# Patient Record
Sex: Female | Born: 2005 | Race: Black or African American | Hispanic: No | Marital: Single | Smoking: Never smoker
Health system: Southern US, Community
[De-identification: ages and names within clinical notes are randomized; demographics above are authoritative.]

## PROBLEM LIST (undated history)

## (undated) DIAGNOSIS — Z21 Asymptomatic human immunodeficiency virus [HIV] infection status: Secondary | ICD-10-CM

## (undated) DIAGNOSIS — B20 Human immunodeficiency virus [HIV] disease: Secondary | ICD-10-CM

## (undated) DIAGNOSIS — J45909 Unspecified asthma, uncomplicated: Secondary | ICD-10-CM

---
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
## 2023-07-12 ENCOUNTER — Other Ambulatory Visit: Payer: Self-pay

## 2023-07-12 ENCOUNTER — Encounter (HOSPITAL_COMMUNITY): Payer: Self-pay

## 2023-07-12 ENCOUNTER — Emergency Department (HOSPITAL_COMMUNITY)
Admission: EM | Admit: 2023-07-12 | Discharge: 2023-07-12 | Disposition: A | Discharge status: Home / Self Care | Attending: Emergency Medicine | Admitting: Emergency Medicine

## 2023-07-12 DIAGNOSIS — R102 Pelvic and perineal pain: Secondary | ICD-10-CM | POA: Insufficient documentation

## 2023-07-12 DIAGNOSIS — R1032 Left lower quadrant pain: Secondary | ICD-10-CM | POA: Diagnosis not present

## 2023-07-12 DIAGNOSIS — R8281 Pyuria: Secondary | ICD-10-CM | POA: Diagnosis not present

## 2023-07-12 DIAGNOSIS — R059 Cough, unspecified: Secondary | ICD-10-CM | POA: Insufficient documentation

## 2023-07-12 DIAGNOSIS — N730 Acute parametritis and pelvic cellulitis: Secondary | ICD-10-CM

## 2023-07-12 DIAGNOSIS — R1031 Right lower quadrant pain: Secondary | ICD-10-CM | POA: Diagnosis not present

## 2023-07-12 LAB — COMPREHENSIVE METABOLIC PANEL WITH GFR
ALT: 17 U/L (ref 0–44)
AST: 21 U/L (ref 15–41)
Albumin: 3.9 g/dL (ref 3.5–5.0)
Alkaline Phosphatase: 46 U/L (ref 38–126)
Anion gap: 8 (ref 5–15)
BUN: 15 mg/dL (ref 6–20)
CO2: 24 mmol/L (ref 22–32)
Calcium: 8.9 mg/dL (ref 8.9–10.3)
Chloride: 107 mmol/L (ref 98–111)
Creatinine, Ser: 0.98 mg/dL (ref 0.44–1.00)
GFR, Estimated: >60 mL/min (ref >60)
Glucose, Bld: 101 mg/dL — ABNORMAL HIGH (ref 70–99)
Potassium: 3.9 mmol/L (ref 3.5–5.1)
Sodium: 139 mmol/L (ref 135–145)
Total Bilirubin: 0.4 mg/dL (ref 0.0–1.2)
Total Protein: 7 g/dL (ref 6.5–8.1)

## 2023-07-12 LAB — URINALYSIS, ROUTINE W REFLEX MICROSCOPIC
Bilirubin Urine: NEGATIVE
Glucose, UA: 50 mg/dL — AB
Hgb urine dipstick: NEGATIVE
Ketones, ur: NEGATIVE mg/dL
Leukocytes,Ua: NEGATIVE
Nitrite: POSITIVE — AB
Protein, ur: 30 mg/dL — AB
Specific Gravity, Urine: 1.024 (ref 1.005–1.030)
pH: 6 (ref 5.0–8.0)

## 2023-07-12 LAB — CBC
HCT: 39.4 % (ref 36.0–46.0)
Hemoglobin: 13 g/dL (ref 12.0–15.0)
MCH: 32.3 pg (ref 26.0–34.0)
MCHC: 33 g/dL (ref 30.0–36.0)
MCV: 97.8 fL (ref 80.0–100.0)
Platelets: 279 10*3/uL (ref 150–400)
RBC: 4.03 MIL/uL (ref 3.87–5.11)
RDW: 12.6 % (ref 11.5–15.5)
WBC: 9.5 10*3/uL (ref 4.0–10.5)
nRBC: 0 % (ref 0.0–0.2)

## 2023-07-12 LAB — RPR: RPR Ser Ql: NONREACTIVE

## 2023-07-12 LAB — WET PREP, GENITAL
Clue Cells Wet Prep HPF POC: NONE SEEN
Sperm: NONE SEEN
Trich, Wet Prep: NONE SEEN
WBC, Wet Prep HPF POC: >=10 — AB (ref <10)
Yeast Wet Prep HPF POC: NONE SEEN

## 2023-07-12 LAB — LIPASE, BLOOD: Lipase: 28 U/L (ref 11–51)

## 2023-07-12 LAB — HCG, SERUM, QUALITATIVE: Preg, Serum: NEGATIVE

## 2023-07-12 MED ORDER — DOXYCYCLINE HYCLATE 100 MG PO TABS
100.0000 mg | ORAL_TABLET | Freq: Once | ORAL | Status: AC
  Administered 2023-07-12: 100 mg via ORAL
  Filled 2023-07-12: qty 1

## 2023-07-12 MED ORDER — METRONIDAZOLE 500 MG PO TABS: 500.0000 mg | ORAL_TABLET | Freq: Two times a day (BID) | ORAL | 0 refills | Status: DC

## 2023-07-12 MED ORDER — FENTANYL CITRATE PF 50 MCG/ML IJ SOSY
50.0000 ug | PREFILLED_SYRINGE | Freq: Once | INTRAMUSCULAR | Status: AC
  Administered 2023-07-12: 50 ug via INTRAVENOUS
  Filled 2023-07-12: qty 1

## 2023-07-12 MED ORDER — SODIUM CHLORIDE 0.9 % IV SOLN
1.0000 g | Freq: Once | INTRAVENOUS | Status: AC
  Administered 2023-07-12: 1 g via INTRAVENOUS
  Filled 2023-07-12: qty 10

## 2023-07-12 MED ORDER — DOXYCYCLINE HYCLATE 100 MG PO CAPS: 100.0000 mg | ORAL_CAPSULE | Freq: Two times a day (BID) | ORAL | 0 refills | Status: DC

## 2023-07-12 MED ORDER — KETOROLAC TROMETHAMINE 60 MG/2ML IM SOLN
60.0000 mg | Freq: Once | INTRAMUSCULAR | Status: AC
  Administered 2023-07-12: 60 mg via INTRAMUSCULAR
  Filled 2023-07-12: qty 2

## 2023-07-12 MED ORDER — METRONIDAZOLE 500 MG/100ML IV SOLN
500.0000 mg | Freq: Once | INTRAVENOUS | Status: AC
  Administered 2023-07-12: 500 mg via INTRAVENOUS
  Filled 2023-07-12: qty 100

## 2023-07-12 MED ORDER — ONDANSETRON HCL 4 MG PO TABS: 4.0000 mg | ORAL_TABLET | Freq: Three times a day (TID) | ORAL | 0 refills | Status: DC | PRN

## 2023-07-12 MED ORDER — ONDANSETRON HCL 4 MG/2ML IJ SOLN
4.0000 mg | Freq: Once | INTRAMUSCULAR | Status: AC
  Administered 2023-07-12: 4 mg via INTRAVENOUS
  Filled 2023-07-12: qty 2

## 2023-07-12 MED ORDER — OXYCODONE HCL 5 MG PO TABS: 2.5000 mg | ORAL_TABLET | ORAL | 0 refills | Status: DC | PRN

## 2023-07-12 NOTE — Discharge Instructions (Addendum)
 Follow these instructions at home: Take over-the-counter and prescription medicines only as told by your health care provider. If you were prescribed an antibiotic medicine, take it as told by your health care provider. Do not stop using the antibiotic even if you start to feel better. Do not have sex until treatment is completed or as told by your health care provider. If PID is confirmed, your recent sexual partners will need treatment, especially if you had unprotected sex. Keep all follow-up visits. This is important. Contact a health care provider if: You have increased or abnormal vaginal discharge. Your pain does not improve. You have a fever or chills. You cannot tolerate your medicines. Your partner has an STI. You have pain when you urinate. Get help right away if: You have increased abdominal or pelvic pain. Your symptoms are getting worse. Your symptoms are not better in 72 hours with treatment.

## 2023-07-12 NOTE — ED Notes (Signed)
 Add on urine culture

## 2023-07-12 NOTE — ED Provider Notes (Signed)
 [place] EMERGENCY DEPARTMENT AT [facility] Provider Note   CSN: [id number] Arrival date & time: 07/12/23  4098     History  Chief Complaint  Patient presents with   Abdominal Pain    Gina Macdonald is a 18 y.o. female who presents emergency department with a chief complaint of pelvic pain.  Onset 8 days ago.  Progressively worsening.  Patient did have some burning with urination and noticed a little bit of yellow discharge from her vagina.  LMP was 5 7-5 12.  She has had unprotected intercourse.  She took Azo Standard prior to arrival which decreased her symptoms of dysuria but she continues to have pelvic pain.  She denies fevers or chills.  Rates pain at 10/10.  Worse with movement coughing and laughing.  Denies flank pain   Abdominal Pain      Home Medications Prior to Admission medications   Medication Sig Start Date End Date Taking? Authorizing Provider  bictegravir-emtricitabine-tenofovir AF (BIKTARVY) 50-200-25 MG TABS tablet Take 1 tablet by mouth daily.   Yes [provider]  albuterol  (PROVENTIL ) (2.5 MG/3ML) 0.083% nebulizer solution Take 3 mLs (2.5 mg total) by nebulization every 6 (six) hours as needed for wheezing. 06/22/12  Yes Vedia Geralds, NP  ZAFEMY 150-35 MCG/24HR transdermal patch Place 1 patch onto the skin once a week. 03/31/23  Yes [provider]      Allergies    Patient has no known allergies.    Review of Systems   Review of Systems  Gastrointestinal:  Positive for abdominal pain.    Physical Exam Updated Vital Signs BP 110/77   Pulse 83   Temp 98.3 F (36.8 C)   Resp (!) 21   Ht 5\' 2"  (1.575 m)   Wt 76.7 kg   LMP 07/06/2023 (Exact Date)   SpO2 100%   BMI 30.91 kg/m  Physical Exam Vitals and nursing note reviewed.  Constitutional:      General: She is not in acute distress.    Appearance: She is well-developed. She is not diaphoretic.     Comments: Tearful  HENT:     Head: Normocephalic and  atraumatic.     Right Ear: External ear normal.     Left Ear: External ear normal.     Nose: Nose normal.     Mouth/Throat:     Mouth: Mucous membranes are moist.  Eyes:     General: No scleral icterus.    Conjunctiva/sclera: Conjunctivae normal.  Cardiovascular:     Rate and Rhythm: Normal rate and regular rhythm.     Heart sounds: Normal heart sounds. No murmur heard.    No friction rub. No gallop.  Pulmonary:     Effort: Pulmonary effort is normal. No respiratory distress.     Breath sounds: Normal breath sounds.  Abdominal:     General: Bowel sounds are normal. There is no distension.     Palpations: Abdomen is soft. There is no mass.     Tenderness: There is abdominal tenderness in the right lower quadrant, suprapubic area and left lower quadrant. There is no right CVA tenderness, left CVA tenderness or guarding.  Genitourinary:    Comments: Normal external female genitalia.  There is copious yellow thin discharge at the introitus and in the vaginal vault.  Copious purulent appearing discharge from the cervical os with positive cervical motion tenderness.  Patient had difficulty tolerating the exam.  Findings consistent with pelvic inflammatory disease. Musculoskeletal:  Cervical back: Normal range of motion.  Skin:    General: Skin is warm and dry.  Neurological:     Mental Status: She is alert and oriented to person, place, and time.  Psychiatric:        Behavior: Behavior normal.    ED Results / Procedures / Treatments   Labs (all labs ordered are listed, but only abnormal results are displayed) Labs Reviewed  COMPREHENSIVE METABOLIC PANEL WITH GFR - Abnormal; Notable for the following components:      Result Value   Glucose, Bld 101 (*)    All other components within normal limits  URINALYSIS, ROUTINE W REFLEX MICROSCOPIC - Abnormal; Notable for the following components:   Color, Urine ORANGE (*)    Glucose, UA 50 (*)    Protein, ur 30 (*)    Nitrite POSITIVE  (*)    Bacteria, UA RARE (*)    All other components within normal limits  URINE CULTURE  LIPASE, BLOOD  CBC  HCG, SERUM, QUALITATIVE    EKG None  Radiology No results found.  Procedures Procedures    Medications Ordered in ED Medications - No data to display  ED Course/ Medical Decision Making/ A&P                                 Medical Decision Making This is a 18 year old female who presents emergency department chief complaint of pelvic pain. Differential diagnosis of her lower abdominal considerations include pelvic inflammatory disease, ectopic pregnancy, appendicitis, urinary calculi, primary dysmenorrhea, septic abortion, ruptured ovarian cyst or tumor, ovarian torsion, tubo-ovarian abscess, degeneration of fibroid, endometriosis, diverticulitis, cystitis.  Labs reviewed.  Negative hCG no concern for ectopic pregnancy, septic abortion.  On physical examination patient has cervical motion tenderness with obvious cervicitis and pelvic tenderness.  Findings are consistent with diagnosis of PID. Treated in the emergency department with Flagyl  doxycycline  and Rocephin.  She was given pain medication with significant improvement.  PDMP reviewed during this encounter. Patient given specific contract since not to engage in any sexual intercourse until she has completed her entire course of treatment.  She understands that her GC chlamydia is currently pending she will follow this up on of MyChart but will be treated for chlamydia gonorrhea anyway.  She will be discharged with a 2-week course of doxycycline  and Flagyl .  She is also given Zofran  and pain medications.  Discussed return precautions thoroughly with the patient.  [facility] and she understands that should her GC chlamydia come back positive her partner will need treatment.  Patient appears otherwise appropriate for discharge at this time she is  stable without fever or signs of sepsis and appropriate for discharge.  Amount and/or Complexity of Data Reviewed Labs: ordered.    Details: I reviewed the patient's labs including CBC, lipase and CMP as well as hCG which were essentially within normal limits and negative for positive hCG. Urine and pillars equivocal.  Nitrates are positive which is likely secondary to her use of Azo Standard.  She has rare bacteria but does have elevated pyuria at 21-50 white blood cells per hpf.  I have sent the urine for culture.  Risk Prescription drug management.           Final Clinical Impression(s) / ED Diagnoses Final diagnoses:  None    Rx / DC Orders ED Discharge  Orders     None         Tama Fails, PA-C 07/12/23 1554    Burnette Carte, MD 07/13/23 [phone]

## 2023-07-12 NOTE — ED Triage Notes (Signed)
 Pt arrived from home via POV c/o abd pain 10/10 that began around 05/05. Pt states that it did hurt when voiding til pt took AZO at 1700 07/11/2023 now only the lower abd pain remains.

## 2023-07-13 ENCOUNTER — Ambulatory Visit (HOSPITAL_COMMUNITY): Payer: Self-pay

## 2023-07-13 LAB — URINE CULTURE: Culture: <10000 — AB

## 2023-07-13 LAB — GC/CHLAMYDIA PROBE AMP (~~LOC~~) NOT AT ARMC
Chlamydia: POSITIVE — AB
Comment: NEGATIVE
Comment: NORMAL
Neisseria Gonorrhea: POSITIVE — AB

## 2023-07-20 LAB — HIV-1/HIV-2 QUALITATIVE RNA

## 2023-07-20 LAB — HIV ANTIBODY (ROUTINE TESTING W REFLEX): HIV Screen 4th Generation wRfx: REACTIVE — AB

## 2023-12-20 NOTE — Telephone Encounter (Signed)
 Positive chlamydia infection. Will treat for PID given pelvic pain on exam. Negative HCG.  Sent in Rx for doxycycline  and flagyl  x 14 days. Contacted patient to see when she can come back for ceftriaxone  injection.  Left message and sent MyChart message.

## 2024-04-05 ENCOUNTER — Inpatient Hospital Stay (HOSPITAL_COMMUNITY)
Admission: EM | Admit: 2024-04-05 | Discharge: 2024-04-06 | Disposition: A | Discharge status: Home / Self Care | Source: Home / Self Care | Admitting: Obstetrics and Gynecology

## 2024-04-05 ENCOUNTER — Encounter (HOSPITAL_COMMUNITY): Payer: Self-pay

## 2024-04-05 ENCOUNTER — Other Ambulatory Visit: Payer: Self-pay

## 2024-04-05 DIAGNOSIS — N730 Acute parametritis and pelvic cellulitis: Secondary | ICD-10-CM

## 2024-04-05 LAB — COMPREHENSIVE METABOLIC PANEL WITH GFR
ALT: 16 U/L (ref 0–44)
AST: 21 U/L (ref 15–41)
Albumin: 4.2 g/dL (ref 3.5–5.0)
Alkaline Phosphatase: 62 U/L (ref 38–126)
Anion gap: 11 (ref 5–15)
BUN: 11 mg/dL (ref 6–20)
CO2: 23 mmol/L (ref 22–32)
Calcium: 9 mg/dL (ref 8.9–10.3)
Chloride: 105 mmol/L (ref 98–111)
Creatinine, Ser: 0.91 mg/dL (ref 0.44–1.00)
GFR, Estimated: >60 mL/min
Glucose, Bld: 84 mg/dL (ref 70–99)
Potassium: 4.3 mmol/L (ref 3.5–5.1)
Sodium: 139 mmol/L (ref 135–145)
Total Bilirubin: 0.6 mg/dL (ref 0.0–1.2)
Total Protein: 7.3 g/dL (ref 6.5–8.1)

## 2024-04-05 LAB — CBC WITH DIFFERENTIAL/PLATELET
Abs Immature Granulocytes: 0.02 10*3/uL (ref 0.00–0.07)
Basophils Absolute: 0 10*3/uL (ref 0.0–0.1)
Basophils Relative: 1 %
Eosinophils Absolute: 0.1 10*3/uL (ref 0.0–0.5)
Eosinophils Relative: 1 %
HCT: 39 % (ref 36.0–46.0)
Hemoglobin: 12.7 g/dL (ref 12.0–15.0)
Immature Granulocytes: 0 %
Lymphocytes Relative: 39 %
Lymphs Abs: 2.3 10*3/uL (ref 0.7–4.0)
MCH: 31.9 pg (ref 26.0–34.0)
MCHC: 32.6 g/dL (ref 30.0–36.0)
MCV: 98 fL (ref 80.0–100.0)
Monocytes Absolute: 0.4 10*3/uL (ref 0.1–1.0)
Monocytes Relative: 7 %
Neutro Abs: 3.1 10*3/uL (ref 1.7–7.7)
Neutrophils Relative %: 52 %
Platelets: 304 10*3/uL (ref 150–400)
RBC: 3.98 MIL/uL (ref 3.87–5.11)
RDW: 11.9 % (ref 11.5–15.5)
WBC: 5.9 10*3/uL (ref 4.0–10.5)
nRBC: 0 % (ref 0.0–0.2)

## 2024-04-05 LAB — URINALYSIS, ROUTINE W REFLEX MICROSCOPIC
Bilirubin Urine: NEGATIVE
Glucose, UA: NEGATIVE mg/dL
Hgb urine dipstick: NEGATIVE
Ketones, ur: NEGATIVE mg/dL
Nitrite: NEGATIVE
Protein, ur: NEGATIVE mg/dL
Specific Gravity, Urine: 1.026 (ref 1.005–1.030)
pH: 7 (ref 5.0–8.0)

## 2024-04-05 LAB — WET PREP, GENITAL
Sperm: NONE SEEN
Trich, Wet Prep: NONE SEEN
WBC, Wet Prep HPF POC: >=10 — AB
Yeast Wet Prep HPF POC: NONE SEEN

## 2024-04-05 LAB — HCG, QUANTITATIVE, PREGNANCY: hCG, Beta Chain, Quant, S: <1 m[IU]/mL

## 2024-04-05 MED ORDER — METRONIDAZOLE 500 MG PO TABS: 500.0000 mg | ORAL_TABLET | Freq: Two times a day (BID) | ORAL | 0 refills | Status: AC

## 2024-04-05 MED ORDER — CEFTRIAXONE SODIUM 500 MG IJ SOLR
500.0000 mg | Freq: Once | INTRAMUSCULAR | Status: AC
  Administered 2024-04-05: 500 mg via INTRAMUSCULAR
  Filled 2024-04-05: qty 500

## 2024-04-05 MED ORDER — LIDOCAINE HCL (PF) 1 % IJ SOLN
INTRAMUSCULAR | Status: AC
  Administered 2024-04-05: 1 mL
  Filled 2024-04-05: qty 5

## 2024-04-05 MED ORDER — KETOROLAC TROMETHAMINE 30 MG/ML IJ SOLN
30.0000 mg | Freq: Once | INTRAMUSCULAR | Status: AC
  Administered 2024-04-05: 30 mg via INTRAMUSCULAR
  Filled 2024-04-05: qty 1

## 2024-04-05 MED ORDER — DOXYCYCLINE HYCLATE 100 MG PO CAPS: 100.0000 mg | ORAL_CAPSULE | Freq: Two times a day (BID) | ORAL | 0 refills | Status: AC

## 2024-04-05 NOTE — ED Triage Notes (Signed)
 Here by POV from home for body aches, sob and abd/pelvic pain. Pt alert, NAD, calm, interactive, resps e/u, speaking in clear complete sentences. Steady gait. H/o HIV and asthma.

## 2024-04-05 NOTE — ED Triage Notes (Signed)
 Pt states her pelvic pain has been bothering her for a while since she was told she PID & her abd pain has been bothering her since yesterday morning.

## 2024-04-05 NOTE — MAU Provider Note (Signed)
 " History     CSN: [id number]  Arrival date and time: 04/05/24 1549   Event Date/Time   First Provider Initiated Contact with Patient 04/05/24 2310      Chief Complaint  Patient presents with   Abdominal Pain   Pelvic Pain   HPI Gina Macdonald is a 19 y.o. non pregnant female who presents for pelvic pain & vaginal discharge. Initially presented to ED for pelvic pain, vaginal discharge, & body aches. Labs done in ED & then transferred to MAU for evaluation of gyn complaints.  Patient was diagnosed with PID in October. Took oral antibiotics but did not return to office for rocephin . Reports she resumed intercourse with same partner immediately after completing antibiotics.  Current symptoms started 3 weeks ago. Reports pelvic pain & malodorous white vaginal discharge. Had episodes of dysuria a few weeks ago but now says it feels like tightening/pinching pain when she voids. Last intercourse was beginning of January, at the time did not have post coital bleeding or dyspareunia.  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 Sees ID at [facility] & had undetected viral load on last check in October.    Past Medical History:  Diagnosis Date   Asthma    HIV (human immunodeficiency virus infection) (HCC)     Past Surgical History:  Procedure Laterality Date   NO PAST SURGERIES      History reviewed. No pertinent family history.  Social History[1]  Allergies: Allergies[2]  Medications Prior to Admission  Medication Sig Dispense Refill Last Dose/Taking   bictegravir-emtricitabine-tenofovir AF (BIKTARVY) 50-200-25 MG TABS tablet Take 1 tablet by mouth daily.   04/05/2024   FLUoxetine (PROZAC) 40 MG capsule Take 40 mg by mouth daily.   Taking   albuterol  (PROVENTIL ) (2.5 MG/3ML) 0.083% nebulizer solution Take 3 mLs (2.5 mg total) by nebulization every 6 (six) hours as needed for wheezing. 75 mL 12    doxycycline  (VIBRAMYCIN ) 100 MG capsule Take 1 capsule (100 mg total) by mouth 2 (two) times daily. One po bid  x 7 days 28 capsule 0    metroNIDAZOLE  (FLAGYL ) 500 MG tablet Take 1 tablet (500 mg total) by mouth 2 (two) times daily. 28 tablet 0    ondansetron  (ZOFRAN ) 4 MG tablet Take 1 tablet (4 mg total) by mouth every 8 (eight) hours as needed for nausea or vomiting. 10 tablet 0    oxyCODONE  (ROXICODONE ) 5 MG immediate release tablet Take 0.5-1 tablets (2.5-5 mg total) by mouth every 4 (four) hours as needed for severe pain (pain score 7-10). 12 tablet 0    ZAFEMY 150-35 MCG/24HR transdermal patch Place 1 patch onto the skin once a week.       Review of Systems  All other systems reviewed and are negative.  Physical Exam   Blood pressure 129/69, pulse 76, temperature 98.6 F (37 C), resp. rate 16, height 5' 2 (1.575 m), weight 77.6 kg, last menstrual period 03/20/2024, SpO2 100%.  Physical Exam Vitals and nursing note reviewed. Exam conducted with a chaperone present.  Constitutional:      General: She is not in acute distress.    Appearance: She is well-developed. She is not ill-appearing.  HENT:     Head: Normocephalic and atraumatic.  Eyes:     General: No scleral icterus.       Right eye: No discharge.        Left eye: No discharge.     Conjunctiva/sclera: Conjunctivae normal.  Pulmonary:     Effort: Pulmonary  effort is normal. No respiratory distress.  Genitourinary:    General: Normal vulva.     Exam position: Lithotomy position.     Labia:        Right: No lesion.        Left: No lesion.      Vagina: No erythema.     Cervix: Cervical motion tenderness, discharge, friability and erythema present.     Uterus: Normal.      Adnexa: Right adnexa normal and left adnexa normal.  Neurological:     General: No focal deficit present.     Mental Status: She is alert.  Psychiatric:        Mood and Affect: Mood normal.        Behavior: Behavior normal.     MAU Course  Procedures Results for orders placed or performed during the hospital encounter of 04/05/24 (from the past 24  hours)  CBC with Differential     Status: None   Collection Time: 04/05/24  6:09 PM  Result Value Ref Range   WBC 5.9 4.0 - 10.5 K/uL   RBC 3.98 3.87 - 5.11 MIL/uL   Hemoglobin 12.7 12.0 - 15.0 g/dL   HCT 60.9 63.9 - 53.9 %   MCV 98.0 80.0 - 100.0 fL   MCH 31.9 26.0 - 34.0 pg   MCHC 32.6 30.0 - 36.0 g/dL   RDW 88.0 88.4 - 84.4 %   Platelets 304 150 - 400 K/uL   nRBC 0.0 0.0 - 0.2 %   Neutrophils Relative % 52 %   Neutro Abs 3.1 1.7 - 7.7 K/uL   Lymphocytes Relative 39 %   Lymphs Abs 2.3 0.7 - 4.0 K/uL   Monocytes Relative 7 %   Monocytes Absolute 0.4 0.1 - 1.0 K/uL   Eosinophils Relative 1 %   Eosinophils Absolute 0.1 0.0 - 0.5 K/uL   Basophils Relative 1 %   Basophils Absolute 0.0 0.0 - 0.1 K/uL   Immature Granulocytes 0 %   Abs Immature Granulocytes 0.02 0.00 - 0.07 K/uL  Comprehensive metabolic panel     Status: None   Collection Time: 04/05/24  6:09 PM  Result Value Ref Range   Sodium 139 135 - 145 mmol/L   Potassium 4.3 3.5 - 5.1 mmol/L   Chloride 105 98 - 111 mmol/L   CO2 23 22 - 32 mmol/L   Glucose, Bld 84 70 - 99 mg/dL   BUN 11 6 - 20 mg/dL   Creatinine, Ser 9.08 0.44 - 1.00 mg/dL   Calcium 9.0 8.9 - 89.6 mg/dL   Total Protein 7.3 6.5 - 8.1 g/dL   Albumin 4.2 3.5 - 5.0 g/dL   AST 21 15 - 41 U/L   ALT 16 0 - 44 U/L   Alkaline Phosphatase 62 38 - 126 U/L   Total Bilirubin 0.6 0.0 - 1.2 mg/dL   GFR, Estimated >39 >39 mL/min   Anion gap 11 5 - 15  Urinalysis, Routine w reflex microscopic -Urine, Clean Catch     Status: Abnormal   Collection Time: 04/05/24  6:09 PM  Result Value Ref Range   Color, Urine YELLOW YELLOW   APPearance HAZY (A) CLEAR   Specific Gravity, Urine 1.026 1.005 - 1.030   pH 7.0 5.0 - 8.0   Glucose, UA NEGATIVE NEGATIVE mg/dL   Hgb urine dipstick NEGATIVE NEGATIVE   Bilirubin Urine NEGATIVE NEGATIVE   Ketones, ur NEGATIVE NEGATIVE mg/dL   Protein, ur NEGATIVE NEGATIVE mg/dL   Nitrite  NEGATIVE NEGATIVE   Leukocytes,Ua SMALL (A) NEGATIVE    RBC / HPF 0-5 0 - 5 RBC/hpf   WBC, UA 6-10 0 - 5 WBC/hpf   Bacteria, UA RARE (A) NONE SEEN   Squamous Epithelial / HPF 0-5 0 - 5 /HPF   Mucus PRESENT   hCG, quantitative, pregnancy     Status: None   Collection Time: 04/05/24  6:09 PM  Result Value Ref Range   hCG, Beta Chain, Quant, S <1 <5 mIU/mL  Wet prep, genital     Status: Abnormal   Collection Time: 04/05/24 10:04 PM   Specimen: Vaginal  Result Value Ref Range   Yeast Wet Prep HPF POC NONE SEEN NONE SEEN   Trich, Wet Prep NONE SEEN NONE SEEN   Clue Cells Wet Prep HPF POC PRESENT (A) NONE SEEN   WBC, Wet Prep HPF POC >=10 (A) <10   Sperm NONE SEEN    No results found.  MDM   Assessment and Plan   1. PID (acute pelvic inflammatory disease)    -UPT negative -Wet prep positive for clue cells & WBCs. GC/CT in process. Will treat for PID given history & exam with positive CMT.  -Rocephin  500 mg given in MAU. Rx sent in for doxy & flagyl  -U/a with leuks. Will send for culture given recent episode of dysuria.  -Patient plans on f/u with ID next week & her PCP.   Rocky Satterfield 04/05/2024, 11:10 PM      [1]  Social History Tobacco Use   Smoking status: Never    Passive exposure: Never   Smokeless tobacco: Never  Vaping Use   Vaping status: Never Used  Substance Use Topics   Alcohol use: Never   Drug use: Never  [2] No Known Allergies  "

## 2024-04-05 NOTE — MAU Note (Addendum)
 Pt states she has some depression but she is ok currently. She is taking Prozac which was prescribed by [facility]. Pt lives in [place] and does not have a PCP. Encouraged to get in with someone locally who could follow her depression and medications for it.

## 2024-04-05 NOTE — ED Provider Triage Note (Addendum)
 Emergency Medicine Provider Triage Evaluation Note  Gina Macdonald , a 19 y.o. female  was evaluated in triage.  Pt complains of body pain, onset yesterday morning. 1 episode of loose non bloody stool this morning. Pain in muscles, bones, whole body.  Pelvic pain since diagnosis of PID last year. Reports vaginal dc resolved then but returned 2 weeks ago. No new partners. Pretty sure partner at the time was treated.  History of HIV from birth, hasn't been to dr since last Nov, scheduled to go later this month. Compliant with meds. Viral load not detected on last labs.   Review of Systems  Positive: Diarrhea, dysuria  Negative: Fever, cough, congestion, n/v  Physical Exam  BP 124/64   Pulse 94   Temp 98.2 F (36.8 C) (Oral)   Resp 16   Ht 5' 2 (1.575 m)   Wt 77.1 kg   SpO2 98%   BMI 31.09 kg/m  Gen:   Awake, no distress   Resp:  Normal effort  MSK:   Moves extremities without difficulty  Other:    Medical Decision Making  Medically screening exam initiated at 5:54 PM.  Appropriate orders placed.  Gina Macdonald was informed that the remainder of the evaluation will be completed by another provider, this initial triage assessment does not replace that evaluation, and the importance of remaining in the ED until their evaluation is complete.   Labs WNL including CBC, CMP, hcg negative. UA with leukocytes and bacteria, WBC. Discussed with MAU APP Shay who accepts patient in transfer to MAU for non pregnant pelvic pain with discharge.     Beverley Leita LABOR, PA-C 04/05/24 1756    Beverley Leita LABOR, PA-C 04/05/24 1757    Beverley Leita LABOR, PA-C 04/05/24 2154

## 2024-04-05 NOTE — MAU Note (Signed)
 Gina Macdonald is a 19 y.o. at Unknown here in MAU reporting joint pain, muscle pain, and pelvic pain for a week. Having some intermittent spotting and white vag d/c. Some odor to d/c but not bad. Describes pelvic pain like squeezing and hurts when she urinates.   LMP: 03/20/24 Onset of complaint: 1 week Pain score: 7 Vitals:   04/05/24 2218 04/05/24 2219  BP:  129/69  Pulse: 76   Resp: 16   Temp: 98.6 F (37 C)   SpO2: 100%      FHT: na  Lab orders placed from triage: none

## 2024-04-06 LAB — GC/CHLAMYDIA PROBE AMP (~~LOC~~) NOT AT ARMC
Chlamydia: NEGATIVE
Comment: NEGATIVE
Comment: NORMAL
Neisseria Gonorrhea: NEGATIVE

## 2024-04-06 NOTE — Progress Notes (Signed)
 Written and verbal d/c instructions given and pt voiced understanding. Understands she will have 2 antibiotics to pick up from the pharmacy and to take as directed
